# Patient Record
Sex: Male | Born: 1995 | Race: White | Hispanic: No | Marital: Single | Smoking: Current every day smoker
Health system: Southern US, Community
[De-identification: ages and names within clinical notes are randomized; demographics above are authoritative.]

---
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
## 2016-02-04 ENCOUNTER — Encounter: Payer: Self-pay | Admitting: Emergency Medicine

## 2016-02-04 ENCOUNTER — Emergency Department
Admission: EM | Admit: 2016-02-04 | Discharge: 2016-02-04 | Disposition: A | Discharge status: Home / Self Care | Payer: Self-pay | Attending: Emergency Medicine | Admitting: Emergency Medicine

## 2016-02-04 ENCOUNTER — Emergency Department: Payer: Self-pay

## 2016-02-04 DIAGNOSIS — F17200 Nicotine dependence, unspecified, uncomplicated: Secondary | ICD-10-CM | POA: Insufficient documentation

## 2016-02-04 DIAGNOSIS — J0110 Acute frontal sinusitis, unspecified: Secondary | ICD-10-CM | POA: Insufficient documentation

## 2016-02-04 MED ORDER — GUAIFENESIN ER 600 MG PO TB12: 600.0000 mg | ORAL_TABLET | Freq: Two times a day (BID) | ORAL | Status: AC

## 2016-02-04 MED ORDER — PSEUDOEPHEDRINE HCL 60 MG PO TABS: 60.0000 mg | ORAL_TABLET | ORAL | Status: AC | PRN

## 2016-02-04 MED ORDER — BUTALBITAL-APAP-CAFFEINE 50-325-40 MG PO TABS
2.0000 | ORAL_TABLET | Freq: Once | ORAL | Status: AC
  Administered 2016-02-04: 2 via ORAL
  Filled 2016-02-04: qty 2

## 2016-02-04 MED ORDER — AMOXICILLIN-POT CLAVULANATE 500-125 MG PO TABS: 1.0000 | ORAL_TABLET | Freq: Three times a day (TID) | ORAL | Status: DC

## 2016-02-04 MED ORDER — IBUPROFEN 800 MG PO TABS: 800.0000 mg | ORAL_TABLET | Freq: Three times a day (TID) | ORAL | Status: AC | PRN

## 2016-02-04 NOTE — ED Notes (Signed)
Patient states he has had congestion for several days. Took Benadryl and Ibuprofen PTA but states as it wears off it comes back.  C/o watery eyes and mild cough.

## 2016-02-04 NOTE — ED Notes (Signed)
Pt to ed with c/o sinus congestion, sneezing, coughing, and facial pressure and headache x 3 days.

## 2016-02-04 NOTE — ED Provider Notes (Signed)
[facility] Emergency Department Provider Note  ____________________________________________  Time seen: Approximately 1:19 PM  I have reviewed the triage vital signs and the nursing notes.   HISTORY  Chief Complaint Headache    HPI David Krause is a 20 y.o. male presents for evaluation of headache and congestion times the last 2 or 3 days. Patient states his headache is progressively gotten worse today and has been ongoing also complains of sinus congestion sneezing and coughing.   History reviewed. No pertinent past medical history.  There are no active problems to display for this patient.   History reviewed. No pertinent past surgical history.  Current Outpatient Rx  Name  Route  Sig  Dispense  Refill  . amoxicillin-clavulanate (AUGMENTIN) 500-125 MG tablet   Oral   Take 1 tablet (500 mg total) by mouth 3 (three) times daily.   30 tablet   0   . guaiFENesin (MUCINEX) 600 MG 12 hr tablet   Oral   Take 1 tablet (600 mg total) by mouth 2 (two) times daily.   60 tablet   2   . ibuprofen (ADVIL,MOTRIN) 800 MG tablet   Oral   Take 1 tablet (800 mg total) by mouth every 8 (eight) hours as needed.   30 tablet   0   . pseudoephedrine (SUDAFED) 60 MG tablet   Oral   Take 1 tablet (60 mg total) by mouth every 4 (four) hours as needed for congestion.   24 tablet   0     Allergies Review of patient's allergies indicates no known allergies.  History reviewed. No pertinent family history.  Social History Social History  Substance Use Topics  . Smoking status: Current Every Day Smoker  . Smokeless tobacco: None  . Alcohol Use: Yes    Review of Systems Constitutional: No fever/chills Eyes: No visual changes. ENT: No sore throat.Positive for frontal maxillary sinus tenderness. For nasal congestion. Cardiovascular: Denies chest pain. Respiratory: Denies shortness of breath. Genitourinary: Negative for dysuria. Musculoskeletal:  Negative for back pain. Skin: Negative for rash. Neurological: Positive for headache.  10-point ROS otherwise negative.  ____________________________________________   PHYSICAL EXAM:  VITAL SIGNS: ED Triage Vitals  Enc Vitals Group     BP 02/04/16 1310 129/67 mmHg     Pulse Rate 02/04/16 1310 71     Resp 02/04/16 1310 20     Temp 02/04/16 1310 98.2 F (36.8 C)     Temp Source 02/04/16 1310 Oral     SpO2 02/04/16 1310 99 %     Weight 02/04/16 1310 150 lb (68.04 kg)     Height 02/04/16 1310 5\' 11"  (1.803 m)     Head Cir --      Peak Flow --      Pain Score 02/04/16 1310 8     Pain Loc --      Pain Edu? --      Excl. in GC? --     Constitutional: Alert and oriented. Well appearing and in no acute distress. Eyes: Conjunctivae are normal. PERRL. EOMI. Head: Atraumatic.Positive right maxillary and facial tenderness. Nose:Positive congestion/rhinnorhea. Mouth/Throat: Mucous membranes are moist.  Oropharynx non-erythematous. Neck: No stridor.   Cardiovascular: Normal rate, regular rhythm. Grossly normal heart sounds.  Good peripheral circulation. Respiratory: Normal respiratory effort.  No retractions. Lungs CTAB. Skin:  Skin is warm, dry and intact. No rash noted. Psychiatric: Mood and affect are normal. Speech and behavior are normal.  ____________________________________________   LABS (all labs ordered  are listed, but only abnormal results are displayed)  Labs Reviewed - No data to display ____________________________________________  EKG   ____________________________________________  RADIOLOGY  IMPRESSION: 1. Extensive sinusitis involving the right frontal sinus, ethmoid air cells (right greater than left) and right maxillary sinus. Osseous structures about the paranasal sinuses appear intact and normal in mineralization. No osseous erosion or defect appreciated. 2. Periorbital and retro-orbital soft tissues are unremarkable, without fluid collection or  soft tissue edema. 3. Superficial soft tissues are unremarkable. No definite fluid or edema. No fluid collection or abscess-like collection. ____________________________________________   PROCEDURES  Procedure(s) performed: None  Critical Care performed: No  ____________________________________________   INITIAL IMPRESSION / ASSESSMENT AND PLAN / ED COURSE  Pertinent labs & imaging results that were available during my care of the patient were reviewed by me and considered in my medical decision making (see chart for details).  Acute sinusitis both maxillary and frontal per ACT. Rx given for Augmentin 875 twice a day, Fioricet as needed for headache Motrin 800 mg 3 times a day. Sudafed 60 mg 4 times a day. Patient follow-up PCP or return ER ____________________________________________   FINAL CLINICAL IMPRESSION(S) / ED DIAGNOSES  Final diagnoses:  Acute frontal sinusitis, recurrence not specified     This chart was dictated using voice recognition software/Dragon. Despite best efforts to proofread, errors can occur which can change the meaning. Any change was purely unintentional.   Evangeline Dakin, PA-C 02/04/16 1531  Arnaldo Natal, MD 02/04/16 [phone]

## 2016-02-04 NOTE — Discharge Instructions (Signed)

## 2018-01-14 ENCOUNTER — Encounter: Payer: Self-pay | Admitting: Emergency Medicine

## 2018-01-14 ENCOUNTER — Emergency Department
Admission: EM | Admit: 2018-01-14 | Discharge: 2018-01-14 | Disposition: A | Discharge status: Home / Self Care | Payer: Self-pay | Attending: Emergency Medicine | Admitting: Emergency Medicine

## 2018-01-14 ENCOUNTER — Other Ambulatory Visit: Payer: Self-pay

## 2018-01-14 ENCOUNTER — Emergency Department: Payer: Self-pay

## 2018-01-14 DIAGNOSIS — J324 Chronic pansinusitis: Secondary | ICD-10-CM | POA: Insufficient documentation

## 2018-01-14 DIAGNOSIS — F17200 Nicotine dependence, unspecified, uncomplicated: Secondary | ICD-10-CM | POA: Insufficient documentation

## 2018-01-14 MED ORDER — AMOXICILLIN-POT CLAVULANATE 875-125 MG PO TABS: 1.0000 | ORAL_TABLET | Freq: Two times a day (BID) | ORAL | 0 refills | Status: AC

## 2018-01-14 MED ORDER — PREDNISONE 10 MG (21) PO TBPK: ORAL_TABLET | ORAL | 0 refills | Status: AC

## 2018-01-14 NOTE — ED Notes (Signed)
First Nurse Note:  Patient states he thinks he has a sinus infection.  Complaining of pressure over right eyebrow, states it "feels like I've been hit with a baseball bat".  Denies injury.

## 2018-01-14 NOTE — ED Triage Notes (Signed)
Patient complaining of facial pain X 4 weeks over right eyebrow.  States he has some rhinitis, patient states he thinks he has a sinus infection.  Pain worse in AM, eases during the day.  Taking OTC meds without improvement.

## 2018-01-14 NOTE — Discharge Instructions (Addendum)
With your regular doctor or [facility] if you are not better in 3 to 5 days.  Use medication as prescribed.  Due to this being a chronic problem we will need to see ENT eventually.  You may need to have sinus surgery but that would be for the specialist to determine.

## 2018-01-14 NOTE — ED Notes (Signed)
See triage note  Presents with facial pain/pressure  Which started about 4 weeks ago  Pain is mainly behind right eye states when pain get bad he gets nauseated  No vomiting or fever  States he has tried OTC meds with some relief

## 2018-01-14 NOTE — ED Provider Notes (Signed)
[facility] Emergency Department Provider Note  ____________________________________________   First MD Initiated Contact with Patient 01/14/18 1014     (approximate)  I have reviewed the triage vital signs and the nursing notes.   HISTORY  Chief Complaint Facial Pain    HPI David Krause is a 23 y.o. male presents emergency department complaining of facial pain and pressure around the right brow.  He states symptoms for about 4 weeks.  He states that the pain is behind his eye.  He states when the pain gets that bad he will get nauseated.  He states the pain does radiate to the right side of his head.  He states he has had a small amount of green mucus.  He is questioning if it is a sinus infection versus a migraine.  His mother is greatly concerned as he inhales a lot of chemicals at work.  She states she knows that they cause cancer and so she is afraid he has tumor.  Patient has a history of migraines.  History reviewed. No pertinent past medical history.  There are no active problems to display for this patient.   History reviewed. No pertinent surgical history.  Prior to Admission medications   Medication Sig Start Date End Date Taking? Authorizing Provider  amoxicillin-clavulanate (AUGMENTIN) 875-125 MG tablet Take 1 tablet by mouth 2 (two) times daily. 01/14/18   Fisher, Roselyn Bering, PA-C  ibuprofen (ADVIL,MOTRIN) 800 MG tablet Take 1 tablet (800 mg total) by mouth every 8 (eight) hours as needed. 02/04/16   Beers, Charmayne Sheer, PA-C  predniSONE (STERAPRED UNI-PAK 21 TAB) 10 MG (21) TBPK tablet Take 6 pills on day one then decrease by 1 pill each day 01/14/18   Faythe Ghee, PA-C  pseudoephedrine (SUDAFED) 60 MG tablet Take 1 tablet (60 mg total) by mouth every 4 (four) hours as needed for congestion. 02/04/16   Beers, Charmayne Sheer, PA-C    Allergies Patient has no known allergies.  No family history on file.  Social History Social History    Tobacco Use  . Smoking status: Current Every Day Smoker  Substance Use Topics  . Alcohol use: Yes  . Drug use: No    Review of Systems  Constitutional: No fever/chills, positive headache of the right brow Eyes: No visual changes.  Except for pain with movement of the right eye ENT: No sore throat.  Said for some sinus congestion with green mucus Respiratory: Denies cough Genitourinary: Negative for dysuria. Musculoskeletal: Negative for back pain. Skin: Negative for rash.    ____________________________________________   PHYSICAL EXAM:  VITAL SIGNS: ED Triage Vitals  Enc Vitals Group     BP 01/14/18 0955 119/78     Pulse Rate 01/14/18 0955 69     Resp 01/14/18 0955 16     Temp 01/14/18 0955 98 F (36.7 C)     Temp Source 01/14/18 0955 Oral     SpO2 01/14/18 0955 99 %     Weight 01/14/18 0956 180 lb (81.6 kg)     Height 01/14/18 0956 6' (1.829 m)     Head Circumference --      Peak Flow --      Pain Score 01/14/18 0956 9     Pain Loc --      Pain Edu? --      Excl. in GC? --     Constitutional: Alert and oriented. Well appearing and in no acute distress. Eyes: Conjunctivae are normal. perrl eomi,  pain reproduced with with upward vertical Head: Atraumatic.  No mass or lesions noted Nose: No congestion/rhinnorhea.  Positive for nasal mucosal swelling Mouth/Throat: Mucous membranes are moist.  Throat is normal Neck: Is supple, no lymphadenopathy is noted Cardiovascular: Normal rate, regular rhythm.  Heart sounds are normal Respiratory: Normal respiratory effort.  No retractions, lungs clear to auscultation GU: deferred Musculoskeletal: FROM all extremities, warm and well perfused Neurologic:  Normal speech and language.  Skin:  Skin is warm, dry and intact. No rash noted. Psychiatric: Mood and affect are normal. Speech and behavior are normal.  ____________________________________________   LABS (all labs ordered are listed, but only abnormal results are  displayed)  Labs Reviewed - No data to display ____________________________________________   ____________________________________________  RADIOLOGY  CT maxillofacial shows chronic pansinusitis  ____________________________________________   PROCEDURES  Procedure(s) performed: No  Procedures    ____________________________________________   INITIAL IMPRESSION / ASSESSMENT AND PLAN / ED COURSE  Pertinent labs & imaging results that were available during my care of the patient were reviewed by me and considered in my medical decision making (see chart for details).  Patient is 22 year old male presents emergency department with right-sided headache and sinus pain.  Symptoms have been ongoing for about 4 weeks.  On physical exam the right frontal sinuses tender to palpation.  Remainder the exam is benign  CT maxillofacial shows chronic pansinusitis.  Explained the CT results.  Patient states he like to try a course of antibiotics.  He was given a prescription for Augmentin and Sterapred.  He is to follow-up with the ENT doctors.  He was given a phone number to make an appointment.  He states he will comply with our instructions.  He was discharged in stable condition     As part of my medical decision making, I reviewed the following data within the electronic MEDICAL RECORD NUMBER Nursing notes reviewed and incorporated, Old chart reviewed, Radiograph reviewed CT maxillofacial was negative for acute abnormality but shows chronic pansinusitis, Notes from prior ED visits and [place] Controlled Substance Database  ____________________________________________   FINAL CLINICAL IMPRESSION(S) / ED DIAGNOSES  Final diagnoses:  Chronic pansinusitis      NEW MEDICATIONS STARTED DURING THIS VISIT:  Discharge Medication List as of 01/14/2018 11:31 AM    START taking these medications   Details  amoxicillin-clavulanate (AUGMENTIN) 875-125 MG tablet Take 1 tablet by mouth 2 (two)  times daily., Starting Tue 01/14/2018, Print    predniSONE (STERAPRED UNI-PAK 21 TAB) 10 MG (21) TBPK tablet Take 6 pills on day one then decrease by 1 pill each day, Print         Note:  This document was prepared using Dragon voice recognition software and may include unintentional dictation errors.    Faythe Ghee, PA-C 01/14/18 1442    Nita Sickle, MD 01/15/18 [phone]

## 2019-01-18 IMAGING — CT CT MAXILLOFACIAL W/O CM
3 series · 15 of 47 positions shown, 18 images · non-contrast
Comparison: 02/04/2016

CLINICAL DATA: History of reconstructive surgery as a child. No
known injury. Pain behind right eye for 4 weeks. Sinus pressure.

EXAM:
CT MAXILLOFACIAL WITHOUT CONTRAST
TECHNIQUE: Multidetector CT imaging of the maxillofacial structures was
performed. Multiplanar CT image reconstructions were also generated.

[Series 2: max soft · axial · 0.32mm/px · z∈[-168,-12]mm · 9 of 92 slices shown, 12 images]
[im 7/92  brain]
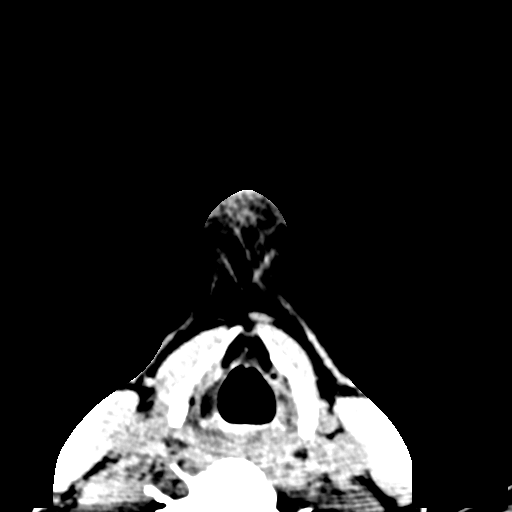
[im 7/92  bone]
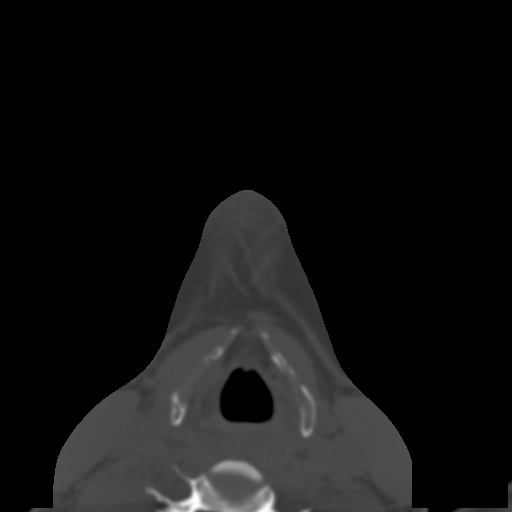
[im 16/92  bone]
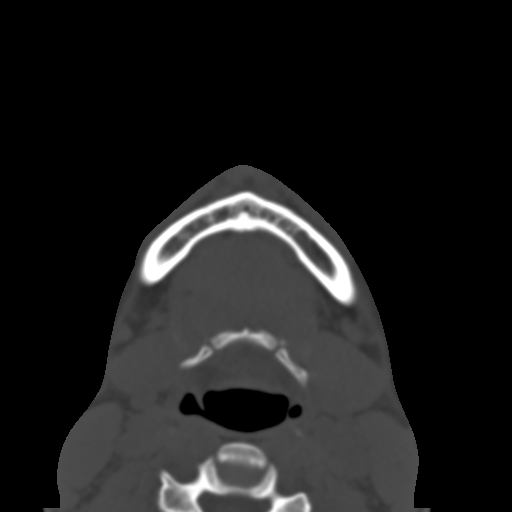
[im 26/92  bone]
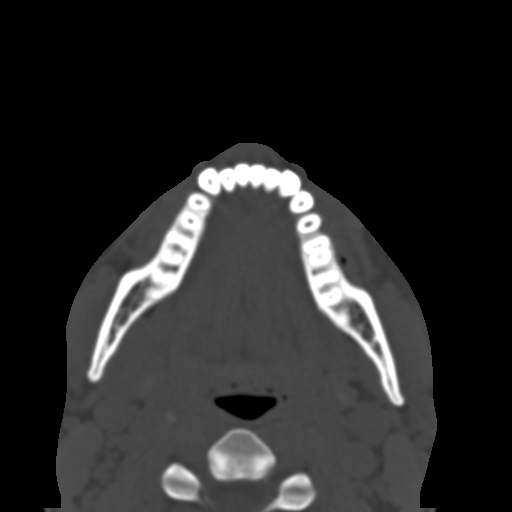
[im 35/92  bone]
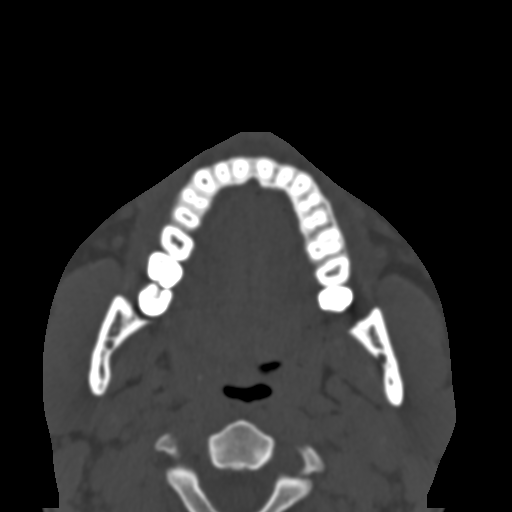
[im 48/92  brain]
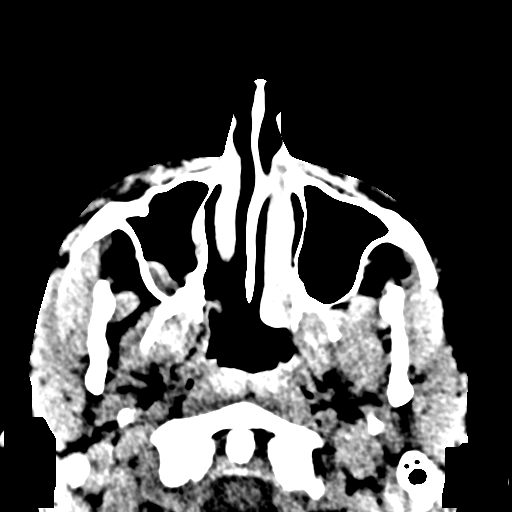
[im 48/92  bone]
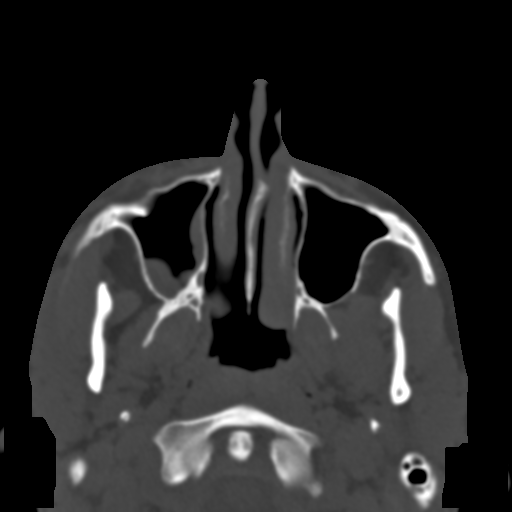
[im 57/92  bone]
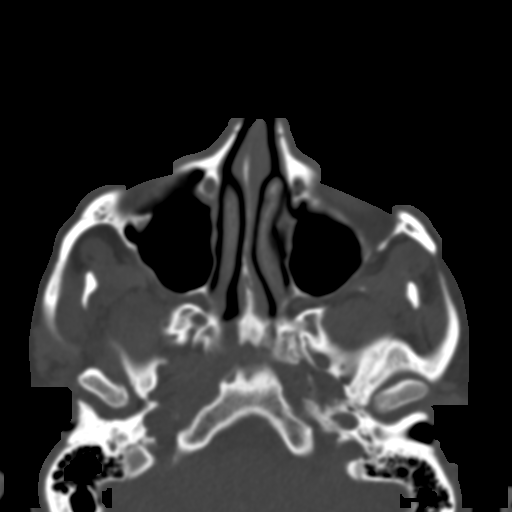
[im 66/92  bone]
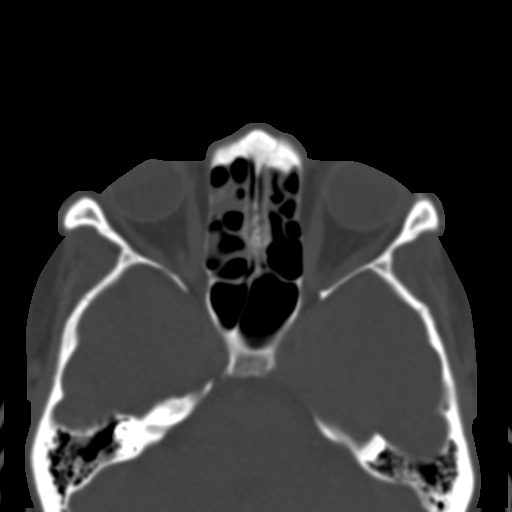
[im 76/92  bone]
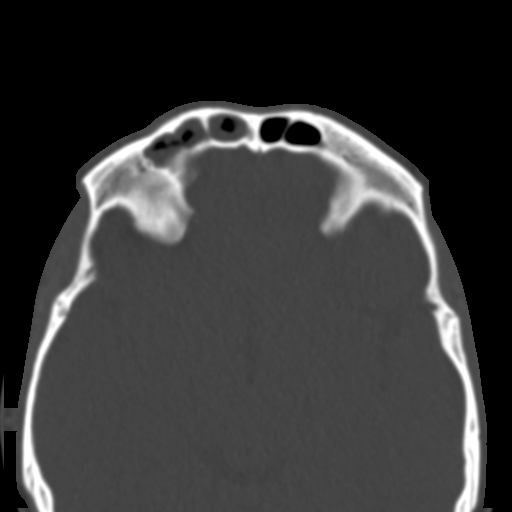
[im 85/92  brain]
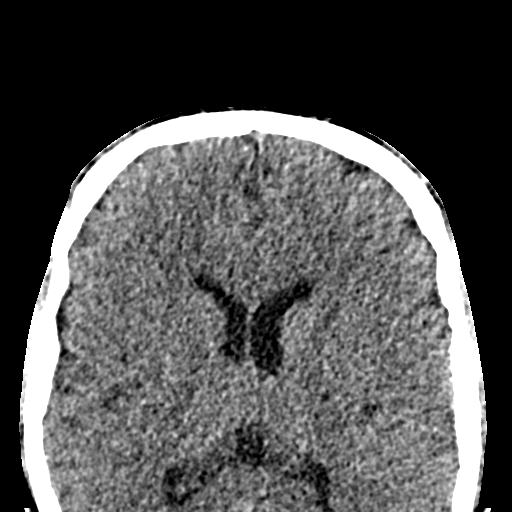
[im 85/92  bone]
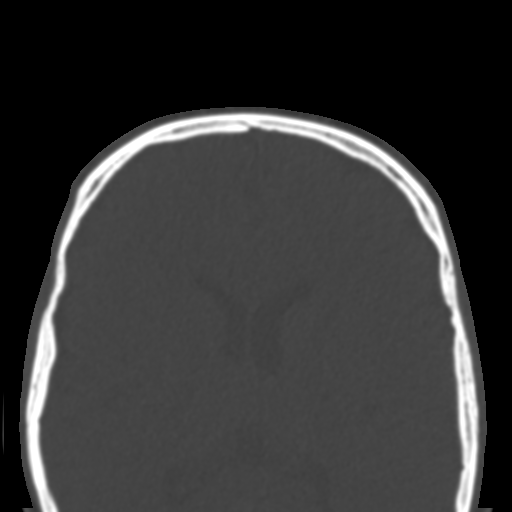

[Series 6: coronal soft · coronal · 0.33mm/px · 3 of 81 slices shown]
[im 27/81  bone]
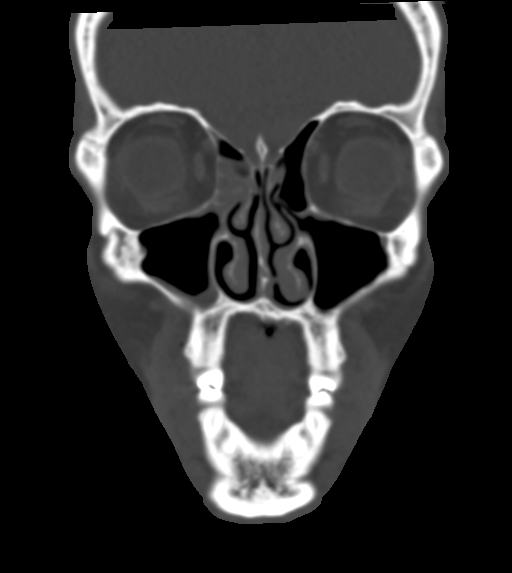
[im 36/81  bone]
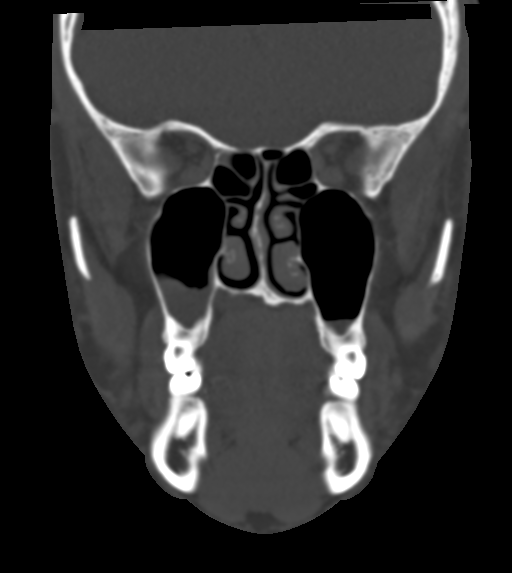
[im 45/81  bone]
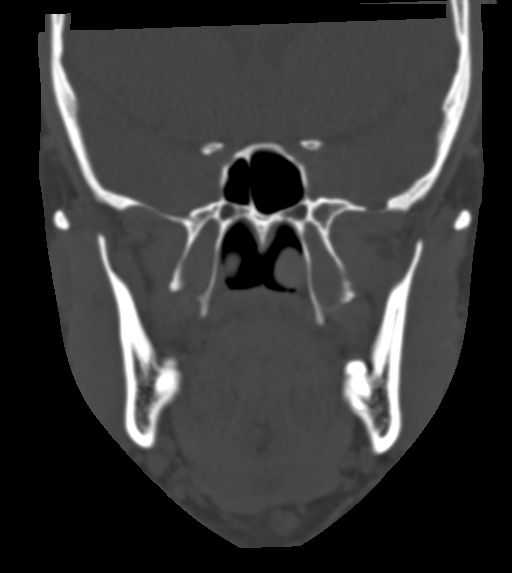

[Series 7: sagittal soft · sagittal · 0.36mm/px · 3 of 82 slices shown]
[im 28/82  bone]
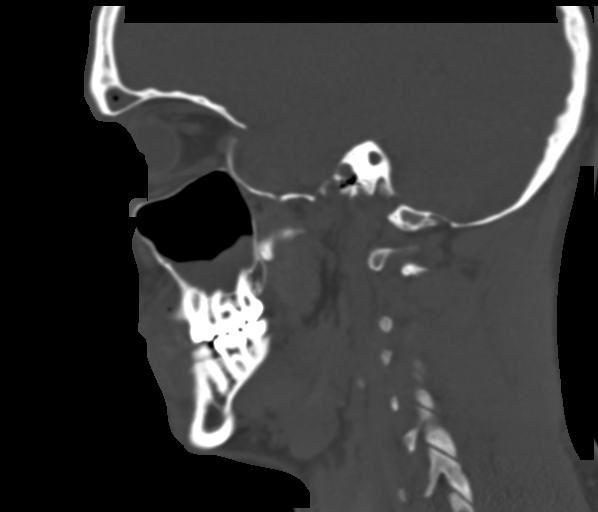
[im 41/82  bone]
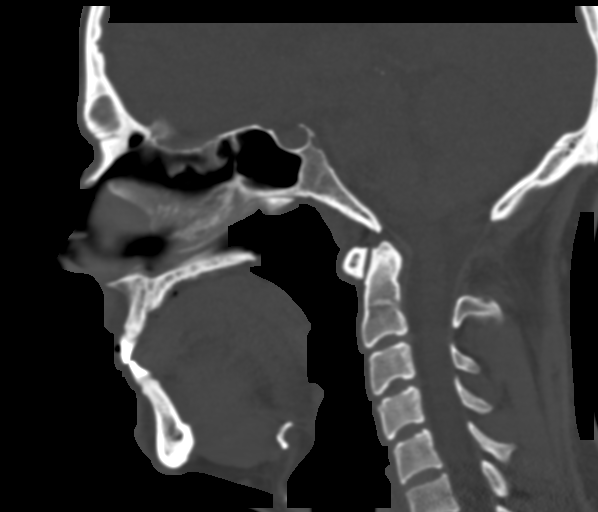
[im 55/82  bone]
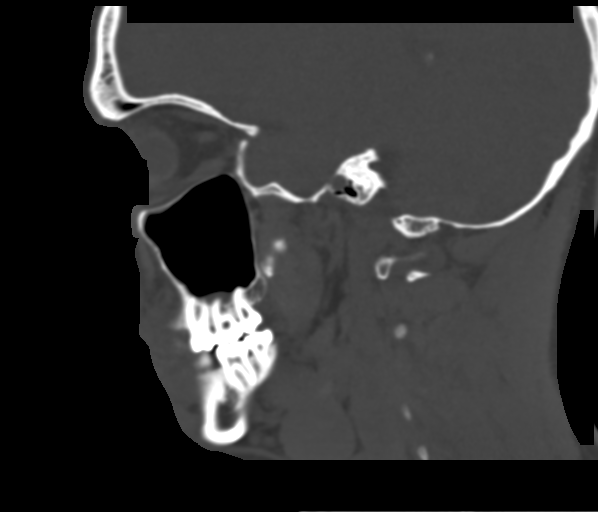

[15 of 47 positions shown; findings below may reference images not displayed]

FINDINGS: Osseous: No fracture or mandibular dislocation. No destructive
process.

Orbits: Negative. No traumatic or inflammatory finding.

Sinuses: Extensive mucosal thickening throughout the paranasal
sinuses, most pronounced in the right frontal, right ethmoids and
floor the right maxillary sinus. Less pronounced changes noted
throughout the left ethmoids and left frontal sinuses. No air-fluid
levels. Findings are similar to prior study. Mastoid air cells
clear.

Soft tissues: Negative

Limited intracranial: No significant or unexpected finding.
IMPRESSION: Extensive chronic sinusitis changes within the paranasal sinuses,
most pronounced in the right paranasal sinuses, similar prior study.
No air-fluid levels to suggest acute sinusitis.

No inflammatory orbital or periorbital process.

No acute bony abnormality.

## 2020-06-28 ENCOUNTER — Other Ambulatory Visit: Payer: Self-pay

## 2020-06-28 ENCOUNTER — Emergency Department
Admission: EM | Admit: 2020-06-28 | Discharge: 2020-06-28 | Disposition: A | Discharge status: Home / Self Care | Payer: Self-pay | Attending: Emergency Medicine | Admitting: Emergency Medicine

## 2020-06-28 DIAGNOSIS — F17200 Nicotine dependence, unspecified, uncomplicated: Secondary | ICD-10-CM | POA: Insufficient documentation

## 2020-06-28 DIAGNOSIS — H9202 Otalgia, left ear: Secondary | ICD-10-CM | POA: Insufficient documentation

## 2020-06-28 DIAGNOSIS — J029 Acute pharyngitis, unspecified: Secondary | ICD-10-CM | POA: Insufficient documentation

## 2020-06-28 LAB — GROUP A STREP BY PCR: Group A Strep by PCR: NOT DETECTED

## 2020-06-28 MED ORDER — LIDOCAINE VISCOUS HCL 2 % MT SOLN: 5.0000 mL | Freq: Four times a day (QID) | OROMUCOSAL | 0 refills | Status: AC | PRN

## 2020-06-28 MED ORDER — PROMETHAZINE-DM 6.25-15 MG/5ML PO SYRP: 5.0000 mL | ORAL_SOLUTION | Freq: Four times a day (QID) | ORAL | 0 refills | Status: AC | PRN

## 2020-06-28 NOTE — Discharge Instructions (Signed)
Follow discharge care instruction take medication as directed. °

## 2020-06-28 NOTE — ED Triage Notes (Signed)
Pt comes via POV from home with c/o sore throat and left ear pain. Pt states this started couple days ago and has gotten worse.

## 2020-06-28 NOTE — ED Provider Notes (Signed)
[facility] Emergency Department Provider Note   ____________________________________________   None    (approximate)  I have reviewed the triage vital signs and the nursing notes.   HISTORY  Chief Complaint Sore Throat and Otalgia    HPI David Krause is a 24 y.o. male patient complain of sore throat and left ear pain for 3 days.  Patient states pain increased with swallowing.  Patient the mild hearing loss in left ear.  Patient denies recent travel or known contact with COVID-19.  Patient has not taken the COVID-19 vaccine.  Rates pain as a 10/10.  Described pain is "sore".  No palliative measure for complaint.         History reviewed. No pertinent past medical history.  There are no problems to display for this patient.   History reviewed. No pertinent surgical history.  Prior to Admission medications   Medication Sig Start Date End Date Taking? Authorizing Provider  amoxicillin-clavulanate (AUGMENTIN) 875-125 MG tablet Take 1 tablet by mouth 2 (two) times daily. 01/14/18   Fisher, Roselyn Bering, PA-C  ibuprofen (ADVIL,MOTRIN) 800 MG tablet Take 1 tablet (800 mg total) by mouth every 8 (eight) hours as needed. 02/04/16   Beers, Charmayne Sheer, PA-C  lidocaine (XYLOCAINE) 2 % solution Use as directed 5 mLs in the mouth or throat every 6 (six) hours as needed for mouth pain. Mix with 5 mL of Phenergan DM for swish and swallow. 06/28/20   Joni Reining, PA-C  predniSONE (STERAPRED UNI-PAK 21 TAB) 10 MG (21) TBPK tablet Take 6 pills on day one then decrease by 1 pill each day 01/14/18   Faythe Ghee, PA-C  promethazine-dextromethorphan (PROMETHAZINE-DM) 6.25-15 MG/5ML syrup Take 5 mLs by mouth 4 (four) times daily as needed. Mix with 5 mL of viscous lidocaine for swish and swallow. 06/28/20   Joni Reining, PA-C  pseudoephedrine (SUDAFED) 60 MG tablet Take 1 tablet (60 mg total) by mouth every 4 (four) hours as needed for congestion. 02/04/16   Beers,  Charmayne Sheer, PA-C    Allergies Patient has no known allergies.  No family history on file.  Social History Social History   Tobacco Use  . Smoking status: Current Every Day Smoker  . Smokeless tobacco: Never Used  Substance Use Topics  . Alcohol use: Not Currently  . Drug use: No    Review of Systems Constitutional: No fever/chills Eyes: No visual changes. ENT: Sore throat and left ear pain.  Cardiovascular: Denies chest pain. Respiratory: Denies shortness of breath. Gastrointestinal: No abdominal pain.  No nausea, no vomiting.  No diarrhea.  No constipation. Genitourinary: Negative for dysuria. Musculoskeletal: Negative for back pain. Skin: Negative for rash. Neurological: Negative for headaches, focal weakness or numbness.   ____________________________________________   PHYSICAL EXAM:  VITAL SIGNS: ED Triage Vitals [06/28/20 0738]  Enc Vitals Group     BP 118/61     Pulse Rate 83     Resp 19     Temp 98.7 F (37.1 C)     Temp src      SpO2 100 %     Weight 175 lb (79.4 kg)     Height 6' (1.829 m)     Head Circumference      Peak Flow      Pain Score 10     Pain Loc      Pain Edu?      Excl. in GC?    Constitutional: Alert and oriented. Well appearing  and in no acute distress. Nose: Edematous nasal turbinates. Mouth/Throat: Mucous membranes are moist.  Postnasal drainage.  Oropharynx erythematous.  No visible exudate. Neck: No stridor.  Hematological/Lymphatic/Immunilogical: No cervical lymphadenopathy. Cardiovascular: Normal rate, regular rhythm. Grossly normal heart sounds.  Good peripheral circulation. Respiratory: Normal respiratory effort.  No retractions. Lungs CTAB.  ____________________________________________   LABS (all labs ordered are listed, but only abnormal results are displayed)  Labs Reviewed  GROUP A STREP BY PCR    ____________________________________________  EKG   ____________________________________________  RADIOLOGY I, Joni Reining, personally viewed and evaluated these images (plain radiographs) as part of my medical decision making, as well as reviewing the written report by the radiologist.  ED MD interpretation:    Official radiology report(s): No results found.  ____________________________________________   PROCEDURES  Procedure(s) performed (including Critical Care):  Procedures   ____________________________________________   INITIAL IMPRESSION / ASSESSMENT AND PLAN / ED COURSE  As part of my medical decision making, I reviewed the following data within the electronic MEDICAL RECORD NUMBER      Patient presents with 3 days of sore throat and left ear pain.  Discussed negative strep results with patient.  Patient complaint physical exam consistent with viral pharyngitis.  Patient given discharge care instruction work note.  Patient vies take medication as directed.  Patient [facility]         ____________________________________________   FINAL CLINICAL IMPRESSION(S) / ED DIAGNOSES  Final diagnoses:  Viral pharyngitis     ED Discharge Orders         Ordered    lidocaine (XYLOCAINE) 2 % solution  Every 6 hours PRN        06/28/20 0927    promethazine-dextromethorphan (PROMETHAZINE-DM) 6.25-15 MG/5ML syrup  4 times daily PRN        06/28/20 1610          *Please note:  David Krause was evaluated in Emergency Department on 06/28/2020 for the symptoms described in the history of present illness. He was evaluated in the context of the global COVID-19 pandemic, which necessitated consideration that the patient might be at risk for infection with the SARS-CoV-2 virus that causes COVID-19. Institutional protocols and algorithms that pertain to the evaluation of patients at risk for COVID-19 are in a state of rapid  change based on information released by regulatory bodies including the CDC and federal and state organizations. These policies and algorithms were followed during the patient's care in the ED.  Some ED evaluations and interventions may be delayed as a result of limited staffing during and the pandemic.*   Note:  This document was prepared using Dragon voice recognition software and may include unintentional dictation errors.    Joni Reining, PA-C 06/28/20 0929    Gilles Chiquito, MD 06/28/20 [phone]
